# Patient Record
Sex: Female | Born: 1985 | Race: White | Hispanic: No | Smoking: Current every day smoker
Health system: Southern US, Community
[De-identification: ages and names within clinical notes are randomized; demographics above are authoritative.]

## PROBLEM LIST (undated history)

## (undated) DIAGNOSIS — F1990 Other psychoactive substance use, unspecified, uncomplicated: Secondary | ICD-10-CM

## (undated) DIAGNOSIS — Z7289 Other problems related to lifestyle: Secondary | ICD-10-CM

## (undated) HISTORY — PX: INDUCED ABORTION: SHX677

---

## 2009-02-14 ENCOUNTER — Ambulatory Visit: Payer: Self-pay | Admitting: Certified Nurse Midwife

## 2009-02-26 ENCOUNTER — Emergency Department: Payer: Self-pay | Admitting: Emergency Medicine

## 2010-09-05 ENCOUNTER — Emergency Department: Payer: Self-pay | Admitting: Emergency Medicine

## 2011-04-07 ENCOUNTER — Emergency Department: Payer: Self-pay | Admitting: Emergency Medicine

## 2011-12-27 ENCOUNTER — Emergency Department: Payer: Self-pay | Admitting: Emergency Medicine

## 2011-12-27 LAB — CBC
HCT: 40.3 % (ref 35.0–47.0)
HGB: 13.2 g/dL (ref 12.0–16.0)
MCH: 28.9 pg (ref 26.0–34.0)
MCHC: 32.7 g/dL (ref 32.0–36.0)
Platelet: 213 10*3/uL (ref 150–440)
RDW: 15.5 % — ABNORMAL HIGH (ref 11.5–14.5)
WBC: 9.8 10*3/uL (ref 3.6–11.0)

## 2011-12-27 LAB — COMPREHENSIVE METABOLIC PANEL
Anion Gap: 11 (ref 7–16)
Calcium, Total: 9 mg/dL (ref 8.5–10.1)
Chloride: 104 mmol/L (ref 98–107)
EGFR (African American): >60
Osmolality: 281 (ref 275–301)
Potassium: 3.5 mmol/L (ref 3.5–5.1)
SGOT(AST): 17 U/L (ref 15–37)
Sodium: 141 mmol/L (ref 136–145)
Total Protein: 7.6 g/dL (ref 6.4–8.2)

## 2011-12-27 LAB — URINALYSIS, COMPLETE
Blood: NEGATIVE
Glucose,UR: NEGATIVE mg/dL (ref 0–75)
Ketone: NEGATIVE
Ph: 5 (ref 4.5–8.0)
Protein: NEGATIVE
RBC,UR: 1 /HPF (ref 0–5)
WBC UR: 6 /HPF (ref 0–5)

## 2011-12-27 LAB — WET PREP, GENITAL

## 2011-12-27 LAB — PREGNANCY, URINE: Pregnancy Test, Urine: NEGATIVE m[IU]/mL

## 2013-11-16 ENCOUNTER — Emergency Department: Payer: Self-pay | Admitting: Emergency Medicine

## 2014-06-26 ENCOUNTER — Emergency Department: Payer: Self-pay | Admitting: Emergency Medicine

## 2014-06-26 LAB — CBC WITH DIFFERENTIAL/PLATELET
BASOS PCT: 0.5 %
Basophil #: 0.1 10*3/uL (ref 0.0–0.1)
EOS ABS: 0.1 10*3/uL (ref 0.0–0.7)
Eosinophil %: 1 %
HCT: 40.6 % (ref 35.0–47.0)
HGB: 13.1 g/dL (ref 12.0–16.0)
Lymphocyte #: 2.3 10*3/uL (ref 1.0–3.6)
Lymphocyte %: 16.7 %
MCH: 28.7 pg (ref 26.0–34.0)
MCHC: 32.2 g/dL (ref 32.0–36.0)
MCV: 89 fL (ref 80–100)
MONO ABS: 0.9 x10 3/mm (ref 0.2–0.9)
Monocyte %: 6.3 %
NEUTROS PCT: 75.5 %
Neutrophil #: 10.6 10*3/uL — ABNORMAL HIGH (ref 1.4–6.5)
Platelet: 208 10*3/uL (ref 150–440)
RBC: 4.56 10*6/uL (ref 3.80–5.20)
RDW: 15.8 % — ABNORMAL HIGH (ref 11.5–14.5)
WBC: 14 10*3/uL — ABNORMAL HIGH (ref 3.6–11.0)

## 2014-06-26 LAB — HCG, QUANTITATIVE, PREGNANCY: Beta Hcg, Quant.: 70386 m[IU]/mL — ABNORMAL HIGH

## 2014-10-25 ENCOUNTER — Emergency Department (HOSPITAL_COMMUNITY)
Admission: EM | Admit: 2014-10-25 | Discharge: 2014-10-25 | Disposition: A | Discharge status: Home / Self Care | Payer: Self-pay | Attending: Emergency Medicine | Admitting: Emergency Medicine

## 2014-10-25 ENCOUNTER — Encounter (HOSPITAL_COMMUNITY): Payer: Self-pay

## 2014-10-25 DIAGNOSIS — R51 Headache: Secondary | ICD-10-CM | POA: Insufficient documentation

## 2014-10-25 DIAGNOSIS — F1920 Other psychoactive substance dependence, uncomplicated: Secondary | ICD-10-CM

## 2014-10-25 DIAGNOSIS — Z720 Tobacco use: Secondary | ICD-10-CM | POA: Insufficient documentation

## 2014-10-25 DIAGNOSIS — F1010 Alcohol abuse, uncomplicated: Secondary | ICD-10-CM | POA: Insufficient documentation

## 2014-10-25 DIAGNOSIS — R45851 Suicidal ideations: Secondary | ICD-10-CM

## 2014-10-25 DIAGNOSIS — Z79899 Other long term (current) drug therapy: Secondary | ICD-10-CM | POA: Insufficient documentation

## 2014-10-25 HISTORY — DX: Other psychoactive substance use, unspecified, uncomplicated: F19.90

## 2014-10-25 HISTORY — DX: Other problems related to lifestyle: Z72.89

## 2014-10-25 LAB — CBC
HEMATOCRIT: 42.2 % (ref 36.0–46.0)
Hemoglobin: 14.2 g/dL (ref 12.0–15.0)
MCH: 29.3 pg (ref 26.0–34.0)
MCHC: 33.6 g/dL (ref 30.0–36.0)
MCV: 87.2 fL (ref 78.0–100.0)
Platelets: 295 10*3/uL (ref 150–400)
RBC: 4.84 MIL/uL (ref 3.87–5.11)
RDW: 15.3 % (ref 11.5–15.5)
WBC: 10.3 10*3/uL (ref 4.0–10.5)

## 2014-10-25 LAB — COMPREHENSIVE METABOLIC PANEL
ALBUMIN: 4.2 g/dL (ref 3.5–5.2)
ALT: 11 U/L (ref 0–35)
AST: 14 U/L (ref 0–37)
Alkaline Phosphatase: 60 U/L (ref 39–117)
Anion gap: 12 (ref 5–15)
BUN: 4 mg/dL — AB (ref 6–23)
CALCIUM: 9.6 mg/dL (ref 8.4–10.5)
CO2: 25 mEq/L (ref 19–32)
CREATININE: 0.98 mg/dL (ref 0.50–1.10)
Chloride: 98 mEq/L (ref 96–112)
GFR calc non Af Amer: 78 mL/min — ABNORMAL LOW (ref >90)
Glucose, Bld: 97 mg/dL (ref 70–99)
POTASSIUM: 3.2 meq/L — AB (ref 3.7–5.3)
Sodium: 135 mEq/L — ABNORMAL LOW (ref 137–147)
TOTAL PROTEIN: 7.4 g/dL (ref 6.0–8.3)
Total Bilirubin: 0.2 mg/dL — ABNORMAL LOW (ref 0.3–1.2)

## 2014-10-25 LAB — SALICYLATE LEVEL

## 2014-10-25 LAB — ACETAMINOPHEN LEVEL: Acetaminophen (Tylenol), Serum: <15 ug/mL (ref 10–30)

## 2014-10-25 LAB — ETHANOL: Alcohol, Ethyl (B): <11 mg/dL (ref 0–11)

## 2014-10-25 NOTE — ED Provider Notes (Signed)
CSN: [id number]     Arrival date & time 10/25/14  1343 History   First MD Initiated Contact with Patient 10/25/14 1359     Chief Complaint  Patient presents with  . Drug Problem     (Consider location/radiation/quality/duration/timing/severity/associated sxs/prior Treatment) HPI Comments: 28 year old female with history of alcohol abuse, smoker, cocaine abuse, marijuana abuse, depression presents wanting help for drug abuse and suicidal ideation. Patient's had gradually worsening depressive symptoms for the past few months. Patient does have children and says she wants to be alive for them. Patient says she could never kill herself she uses had worsening depressive symptoms. Patient last used cocaine history evening and woke up with her mother demanding she gets help.  Patient's children are with the father currently. Patient patient is a Leisure centre managerbartender at work.  Patient is a 28 y.o. female presenting with drug problem. The history is provided by the patient and a relative.  Drug Problem Associated symptoms include headaches (gradual onset similar to previous). Pertinent negatives include no chest pain, no abdominal pain and no shortness of breath.    Past Medical History  Diagnosis Date  . Behavior control problems associated with use of recreational drug    Past Surgical History  Procedure Laterality Date  . Induced abortion     History reviewed. No pertinent family history. History  Substance Use Topics  . Smoking status: Current Every Day Smoker    Types: Cigarettes  . Smokeless tobacco: Not on file  . Alcohol Use: Yes     Comment: social   OB History    No data available     Review of Systems  Constitutional: Negative for fever and chills.  HENT: Negative for congestion.   Eyes: Negative for visual disturbance.  Respiratory: Negative for shortness of breath.   Cardiovascular: Negative for chest pain.  Gastrointestinal: Negative for vomiting and abdominal pain.   Genitourinary: Negative for dysuria and flank pain.  Musculoskeletal: Negative for back pain, neck pain and neck stiffness.  Skin: Negative for rash.  Neurological: Positive for headaches (gradual onset similar to previous). Negative for light-headedness.  Psychiatric/Behavioral: Positive for self-injury and dysphoric mood.      Allergies  Clindamycin/lincomycin  Home Medications   Prior to Admission medications   Medication Sig Start Date End Date Taking? Authorizing Provider  ibuprofen (ADVIL,MOTRIN) 800 MG tablet Take 800 mg by mouth every 8 (eight) hours as needed for headache.   Yes Historical Provider, MD  Pseudoeph-Doxylamine-DM-APAP (NYQUIL PO) Take 1 capsule by mouth every 6 (six) hours.   Yes Historical Provider, MD   BP 119/81 mmHg  Pulse 88  Temp(Src) 98 F (36.7 C) (Oral)  Resp 20  SpO2 98%  LMP 10/18/2014 Physical Exam  Constitutional: She is oriented to person, place, and time. She appears well-developed and well-nourished.  HENT:  Head: Normocephalic and atraumatic.  Eyes: Conjunctivae are normal. Right eye exhibits no discharge. Left eye exhibits no discharge.  Neck: Normal range of motion. Neck supple. No tracheal deviation present.  Cardiovascular: Normal rate and regular rhythm.   Pulmonary/Chest: Effort normal and breath sounds normal.  Abdominal: Soft. She exhibits no distension. There is no tenderness. There is no guarding.  Musculoskeletal: She exhibits no edema.  Neurological: She is alert and oriented to person, place, and time. No cranial nerve deficit.  Skin: Skin is warm. No rash noted.  Psychiatric: She is not agitated. She expresses suicidal ideation. She expresses no homicidal ideation. She expresses no suicidal plans and no homicidal  plans.  Patient tearing in the room, says she wants to get off the drugs.  Nursing note and vitals reviewed.   ED Course  Procedures (including critical care time) Labs Review Labs Reviewed  CBC   ACETAMINOPHEN LEVEL  COMPREHENSIVE METABOLIC PANEL  ETHANOL  SALICYLATE LEVEL  URINE RAPID DRUG SCREEN (HOSP PERFORMED)    Imaging Review No results found.   EKG Interpretation None      MDM   Final diagnoses:  Suicidal ideation  Drug abuse and dependence   Patient presents with primarily drug abuse and wanting treatment. Patient has passive suicidal ideation without a plan. Plan for screening blood work, TTS consult and likely outpatient treatment.  Patient not actively suicidal in the ER, TTS agrees with assessment and recommends outpatient follow-up.  Results and differential diagnosis were discussed with the patient/parent/guardian. Close follow up outpatient was discussed, comfortable with the plan.   Medications - No data to display  Filed Vitals:   10/25/14 1355  BP: 119/81  Pulse: 88  Temp: 98 F (36.7 C)  TempSrc: Oral  Resp: 20  SpO2: 98%    Final diagnoses:  Suicidal ideation  Drug abuse and dependence        Enid SkeensJoshua M Bobbette Eakes, MD 10/25/14 1459

## 2014-10-25 NOTE — Discharge Instructions (Signed)
°Emergency Department Resource Guide °1) Find a Doctor and Pay Out of Pocket °Although you won't have to find out who is covered by your insurance plan, it is a good idea to ask around and get recommendations. You will then need to call the office and see if the doctor you have chosen will accept you as a new patient and what types of options they offer for patients who are self-pay. Some doctors offer discounts or will set up payment plans for their patients who do not have insurance, but you will need to ask so you aren't surprised when you get to your appointment. ° °2) Contact Your Local Health Department °Not all health departments have doctors that can see patients for sick visits, but many do, so it is worth a call to see if yours does. If you don't know where your local health department is, you can check in your phone book. The CDC also has a tool to help you locate your state's health department, and many state websites also have listings of all of their local health departments. ° °3) Find a Walk-in Clinic °If your illness is not likely to be very severe or complicated, you may want to try a walk in clinic. These are popping up all over the country in pharmacies, drugstores, and shopping centers. They're usually staffed by nurse practitioners or physician assistants that have been trained to treat common illnesses and complaints. They're usually fairly quick and inexpensive. However, if you have serious medical issues or chronic medical problems, these are probably not your best option. ° °No Primary Care Doctor: °- Call [facility] at  832-8000 - they can help you locate a primary care doctor that  accepts your insurance, provides certain services, etc. °- Physician Referral Service- 1-800-533-3463 ° °Chronic Pain Problems: °Organization         Address  Phone   Notes  °[facility]  (336) 297-2271 Patients need to be referred by their primary care doctor.  ° °Medication  Assistance: °Organization         Address  Phone   Notes  °[place] [facility] [address] °[place], [place] [address] (336) 641-8030 --Must be a resident of [place] °-- Must have NO insurance coverage whatsoever (no Medicaid/ Medicare, etc.) °-- The pt. MUST have a primary care doctor that directs their care regularly and follows them in the community °  °[facility]  (866) 331-1348   °[employer]  (888) 892-1162   ° °Agencies that provide inexpensive medical care: °Organization         Address  Phone   Notes  °[facility]  (336) 832-8035   °[facility]    (336) 832-7272   °[facility] [address] °[place],  [address] (336) 832-4777   °[facility] [address], °[place] (336) 271-4999   °[facility]    (336) 373-0678   °[facility]    (336) 272-1050   °[facility] ° [address], [place] Phone:  (336) 832-4444, Fax:  (336) 832-4440 Hours of Operation:  9 am - 6 pm, M-F.  Also accepts Medicaid/Medicare and self-pay.  °[facility] ° [address], [place] Phone: (336) 832-3150, Fax: (336) 832-3151. Hours of Operation:  8:30 am - 5:30 pm, M-F.  Also accepts Medicaid and self-pay.  °[facility] [place] [address], [place] Phone: (336) 878-6027   °[facility] [address], [place], [place] (336)723-1848, Ext. 123 Mondays & Thursdays: 7-9 AM.  First 15 patients are seen on a first come, first serve basis. °  ° °Medicaid-accepting [place] Providers: ° °Organization         Address  Phone   Notes  °[facility] [address], [place] (336) 641-2100 Also accepts self-pay patients.  °[facility] [address], [place] ° (336) 856-9996   °[facility] [address], [place]  (336) 288-8857   °[facility] [address] [place] [address], [place] (336) 299-7000   °Veita Bland [address], [place]  ° (336) 373-1557 Only accepts [place] Access Medicaid patients after they have their name applied to their card.  ° °Self-Pay (no insurance) in [place]: ° °Organization         Address  Phone   Notes  °Sickle Cell Patients, [facility] [address], [place] (336) 832-1970   °[facility] [address], [place] (336) 832-4400   °[facility] ° [address], [place] (336) 992-4800   °[facility]/Dr. Osei-Bonsu ° [address] [place] [address], [place] or [address], [place] (336) 841-8500 Phone number for both [place] and [place] locations is the same.  °[facility] [address], [place] (336) 299-0000   °[facility] [address] [place] [address], [place] or [address] (336) 852-7530 °(336) 878-2260   °[facility] [address], [place] (336) 350-1642, phone; (336) 294-5005, fax Sees patients 1st and 3rd Saturday of every month.  Must not qualify for public or private insurance (i.e. Medicaid, Medicare, [place] Health Choice, Veterans' Benefits) • Household income should be no more than 200% of the poverty level •The clinic cannot treat you if you are pregnant or think you are pregnant • Sexually transmitted diseases are not treated at the clinic.  ° ° °Dental Care: °Organization         Address  Phone  Notes  °[place] [facility] [address],  (336) 641-6152 Accepts children up to age 21 who are enrolled in Medicaid or [place] Health Choice; pregnant women with a Medicaid card; and children who have applied for Medicaid or [place] Health Choice, but were declined, whose parents can pay a reduced fee at time of service.  °[place]  [facility] [place]  [address], [place] (336) 641-7733 Accepts children up to age 21 who are enrolled in Medicaid or [place] Health Choice; pregnant women with a Medicaid card; and children who have applied for Medicaid or [place] Health Choice, but were declined, whose parents can pay a reduced fee at time of service.  °[facility] ° [address],  (336) 641-4533 Patients are seen by appointment only. Walk-ins are not accepted. [facility] will see patients 18 years of age and older. °Monday - Tuesday (8am-5pm) °Most Wednesdays (8:30-5pm) °$30 per visit, cash only  °[facility] ° [address], [place] (336) 641-4533 Patients are seen by appointment only. Walk-ins are not accepted. [facility] will see patients 18 years of age and older. °One   Wednesday Evening (Monthly: Volunteer Based).  $30 per visit, cash only  °[facility]  (919) 537-3737 for adults; Children under age 4, call Graduate Pediatric Dentistry at (919) [phone]. Children aged 4-14, please call (919) [phone] to request a pediatric application. ° Dental services are provided in all areas of dental care including fillings, crowns and bridges, complete and partial dentures, implants, gum treatment, root canals, and extractions. Preventive care is also provided. Treatment is provided to both adults and children. °Patients are selected via a lottery and there is often a waiting list. °  °[facility] [address], °[place] ° (336) 763-8833 [email or web address] °  °[facility] [address], [place], [place] (336)723-1848, Ext. 123 Second and Fourth Thursday of each month, opens at 6:30 AM; Clinic ends at 9 AM.  Patients are seen on a first-come first-served basis, and a limited number are seen during each clinic.  ° °[facility] ° [address], [place], [place] (336) 723-7904    Eligibility Requirements °You must have lived in [place], [place], or [place] counties for at least the last three months. °  You cannot be eligible for state or federal sponsored healthcare insurance, including Veterans Administration, Medicaid, or Medicare. °  You generally cannot be eligible for healthcare insurance through your employer.  °  How to apply: °Eligibility screenings are held every Tuesday and Wednesday afternoon from 1:00 pm until 4:00 pm. You do not need an appointment for the interview!  °[facility] [address], [place], [place] 336-631-2330   °[facility]  336-342-8273   °[facility]  336-703-3100   °[facility]  336-570-6415   ° °Behavioral Health Resources in the Community: °Intensive Outpatient Programs °Organization         Address  Phone  Notes  °[facility] [address], [place], [place] 336-878-6098   °[facility] [address], [place], [place] 336-832-9800   °[facility] [address], [place], [place] ° 336-882-2125   °[facility] [address],  °[place], [place] 1-800-853-5163 or 336-641-4981   °Substance Abuse Resources °Organization         Address  Phone  Notes  °[facility]  336-882-2125   °[facility]  336-784-9470   °[facility]  336-285-9073   °[facility]  336-845-3988   °Residential & Outpatient Substance Abuse Program  1-800-659-3381   °Psychological Services °Organization         Address  Phone  Notes  °[facility]  336- 832-9600   °[facility]  336- 378-7881   °[facility] [address], [place] 1-800-853-5163 or 336-641-4981   ° °Mobile Crisis Teams °Organization         Address  Phone  Notes  °[facility], Mobile Crisis Care Unit  1-877-626-1772   °Assertive °[facility] ° [address]  [place], [place] 336-834-9664   °Sharon DeEsch [address] °[place] 336-554-5454   ° °Self-Help/Support Groups °Organization         Address  Phone             Notes  °[facility] [place] - variety of support groups  336- 373-1402 Call for more information  °Narcotics Anonymous (NA), [facility] [address], °[place]  2 meetings at this location  ° °  Residential Treatment Programs °Organization         Address  Phone  Notes  °[facility] [address],    °[place]  1-866-801-8205   °[facility] ° [address], [place], [place] 704-293-8524   °[facility] [address], [place] 336-845-3988 Admissions: 8am-3pm M-F  °[facility] [address]    °[place], [place] 336-841-1104   °[facility] [address], [place], [place] 336-379-7146   °[facility] [address]  °[place], [place] 336-285-9073   °[facility] Intensive Outpatient [address], [place], [place] 336-852-3033   °[facility] [address]  °[place], [place] 1-877-615-2722 or 336-784-9470   °[facility] [address] , [place] 336-227-7417 Accepts Medicaid  °[facility] [address]  °[place] 1-800-659-3381 Substance Abuse/Addiction Treatment  ° °[place] Behavioral Health Resources °Organization         Address  Phone  Notes  °[facility]  (888) 581-9988   °Julie Brannon, PhD [address] [place], [place]   (336) 349-5553 or (336) 951-0000   °[facility]   [address] °[place], [place] (336) 349-4454   °[facility] [address], [place], [place] (336) 342-8316 Insurance/Medicaid/sponsorship through [facility]  °[facility] [address]                                    [place], [place] (336) 342-8316 Therapy/tele-psych/case    °[facility] [address]  ° [place],  (336) 349-2233    °Dr. Arfeen  (336) 349-4544   °[facility] [place]  [employer] [place] [facility] 1) [address], [place] °2) [address], [place] °3)  [address], [place] (336) 349-3220 °(336) 342-7768 ° °(336) 342-8140   °[place] [facility] (336) 342-1394 or (336) 342-3537 (After Hours)    ° ° °

## 2014-10-25 NOTE — BH Assessment (Signed)
Writer received collateral clinical information from Dr. Jodi MourningZavitz.

## 2014-10-25 NOTE — BH Assessment (Signed)
Tele Assessment Note   Brianna Mcbride is an 28 y.o. female requesting detox and treatment from substance abuse.  Patient reports passive SI without a plan.  Patient reports depression associated with her inability to stop using drugs.   Patient's had gradually worsening depressive symptoms for the past few months.   Patient does have children and says she wants to be alive for them. Patient says she could never kill herself she uses had worsening depressive symptoms.  Patient denies prior outpatient mental health servicdes.     Patient last used cocaine today before coming to the ED.  Patient reports that her children are with the father currently.  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  Patient denies HI and Psychosis.  Patient denies physical, sexual or emotional abuse.   Patient denies withdrawal symptoms.  Patient denies seizures.  Patient denies previous detox or treatment for substance abuse.    Axis I: Cocaine Abuse  Axis II: Deferred Axis III:  Past Medical History  Diagnosis Date  . Behavior control problems associated with use of recreational drug    Axis IV: economic problems, other psychosocial or environmental problems and problems related to social environment Axis V: 41-50 serious symptoms  Past Medical History:  Past Medical History  Diagnosis Date  . Behavior control problems associated with use of recreational drug     Past Surgical History  Procedure Laterality Date  . Induced abortion      Family History: History reviewed. No pertinent family history.  Social History:  reports that she has been smoking Cigarettes.  She has been smoking about 0.00 packs per day. She does not have any smokeless tobacco history on file. She reports that she drinks alcohol. She reports that she uses illicit drugs (Cocaine and Marijuana).  Additional Social History:     CIWA: CIWA-Ar BP: 119/81 mmHg Pulse Rate: 88 COWS:    PATIENT STRENGTHS: (choose at  least two) Ability for insight Average or above average intelligence Capable of independent living Communication skills  Allergies:  Allergies  Allergen Reactions  . Clindamycin/Lincomycin Rash    Home Medications:  (Not in a hospital admission)  OB/GYN Status:  Patient's last menstrual period was 10/18/2014.  General Assessment Data Location of Assessment: [facility] Is this a Tele or Face-to-Face Assessment?: Tele Assessment Is this an Initial Assessment or a Re-assessment for this encounter?: Initial Assessment Living Arrangements: Other (Comment) (Lives with a roomate and her minor child) Can pt return to current living arrangement?: Yes Admission Status: Voluntary Is patient capable of signing voluntary admission?: Yes Transfer from: Home Referral Source: Self/Family/Friend  Medical Screening Exam [facility] Walk-in ONLY) Medical Exam completed: Yes  [facility] Living Arrangements: Other (Comment) (Lives with a roomate and her minor child) Name of Psychiatrist: None Reported Name of Therapist: None Reported  Education Status Is patient currently in school?: No Current Grade: NA Highest grade of school patient has completed: NA Name of school: NA Contact person: NA  Risk to self with the past 6 months Suicidal Ideation: Yes-Currently Present Suicidal Intent: Yes-Currently Present Is patient at risk for suicide?: Yes Suicidal Plan?: No Access to Means: No What has been your use of drugs/alcohol within the last 12 months?: Cocaine Previous Attempts/Gestures: No How many times?: 0 Other Self Harm Risks: None Reported Triggers for Past Attempts:  (NA) Intentional Self Injurious Behavior: None Family Suicide History: No Recent stressful life event(s): Financial Problems, Other (Comment) (Cannot stop using  drugs.) Persecutory voices/beliefs?: No Depression: Yes Depression Symptoms: Despondent, Fatigue, Guilt Substance abuse history and/or  treatment for substance abuse?: Yes (Cocaine) Suicide prevention information given to non-admitted patients: Yes  Risk to Others within the past 6 months Homicidal Ideation: No Thoughts of Harm to Others: No Current Homicidal Intent: No Current Homicidal Plan: No Access to Homicidal Means: No Identified Victim: None Reported History of harm to others?: No Assessment of Violence: None Noted Violent Behavior Description: None Reported Does patient have access to weapons?: No Criminal Charges Pending?: No Does patient have a court date: No  Psychosis Hallucinations: None noted Delusions: None noted  Mental Status Report Appear/Hygiene: Disheveled Eye Contact: Fair Motor Activity: Freedom of movement Speech: Logical/coherent Level of Consciousness: Alert Mood: Depressed Affect: Appropriate to circumstance Anxiety Level: Minimal Thought Processes: Coherent, Relevant Judgement: Unimpaired Orientation: Person, Place, Time, Situation Obsessive Compulsive Thoughts/Behaviors: Minimal  Cognitive Functioning Concentration: Decreased Memory: Recent Intact, Remote Intact IQ: Average Insight: Good Impulse Control: Fair Appetite: Good Weight Loss: 0 Weight Gain: 0 Sleep: No Change Total Hours of Sleep: 8 Vegetative Symptoms: None  ADLScreening [facility] Assessment Services) Patient's cognitive ability adequate to safely complete daily activities?: Yes Patient able to express need for assistance with ADLs?: Yes Independently performs ADLs?: Yes (appropriate for developmental age)  Prior Inpatient Therapy Prior Inpatient Therapy: No Prior Therapy Dates: NA Prior Therapy Facilty/Provider(s): NA Reason for Treatment: NA  Prior Outpatient Therapy Prior Outpatient Therapy: No Prior Therapy Dates: NA Prior Therapy Facilty/Provider(s): NA Reason for Treatment: NA  ADL Screening (condition at time of admission) Patient's cognitive ability adequate to safely complete daily  activities?: Yes Patient able to express need for assistance with ADLs?: Yes Independently performs ADLs?: Yes (appropriate for developmental age)             Advance Directives (For Healthcare) Does patient have an advance directive?: No Would patient like information on creating an advanced directive?: No - patient declined information    Additional Information 1:1 In Past 12 Months?: No CIRT Risk: No Elopement Risk: No Does patient have medical clearance?: Yes     Disposition: Per Renata Capriceonrad, NP - patient does not meet criteria for inpatient hospitalization.  Patient will be discharged with outpatient resources.    Disposition Initial Assessment Completed for this Encounter: Yes Disposition of Patient: Other dispositions  Linton RumpStevenson, Brianna Mcbride 10/25/2014 2:43 PM

## 2014-10-25 NOTE — ED Notes (Signed)
Pt states she needs help stopping drugs including cocaine and marijuana.  Really stressed b/c of the "bullsh.." in her life.  Pt grandmother also states she has stated she wanted to kill her self.  Pt denies attempt but acknowledges verbalization.

## 2014-12-23 ENCOUNTER — Emergency Department: Payer: Self-pay | Admitting: Emergency Medicine

## 2015-10-14 IMAGING — CR DG SHOULDER 3+V*R*
1 series · 3 of 3 positions shown · non-contrast
Comparison: None.

CLINICAL DATA: 28-year-old female with 3 week history of right arm
pain which began after chopping [place].

EXAM:
DG SHOULDER 3+ VIEWS RIGHT

[Series 1: dxr shoulder right complete · 0.14mm/px · 3 of 3 slices shown]
[im 1/3]
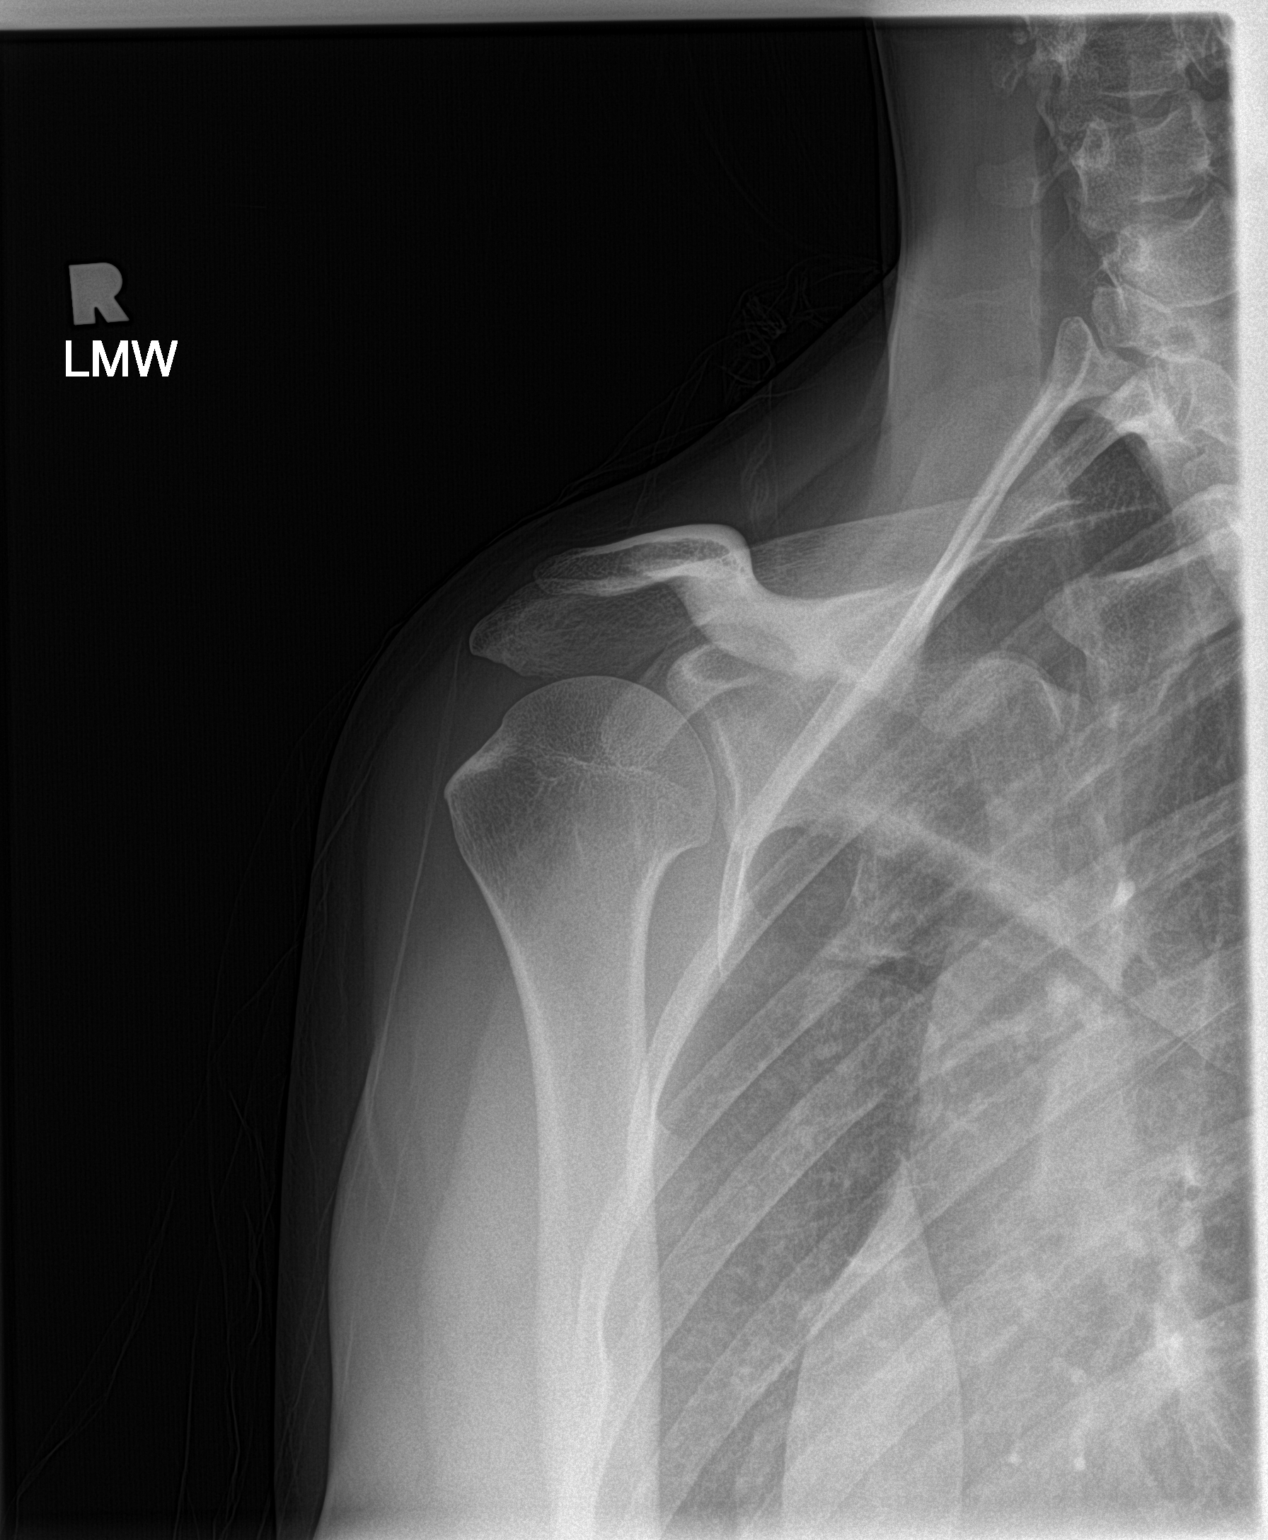
[im 2/3]
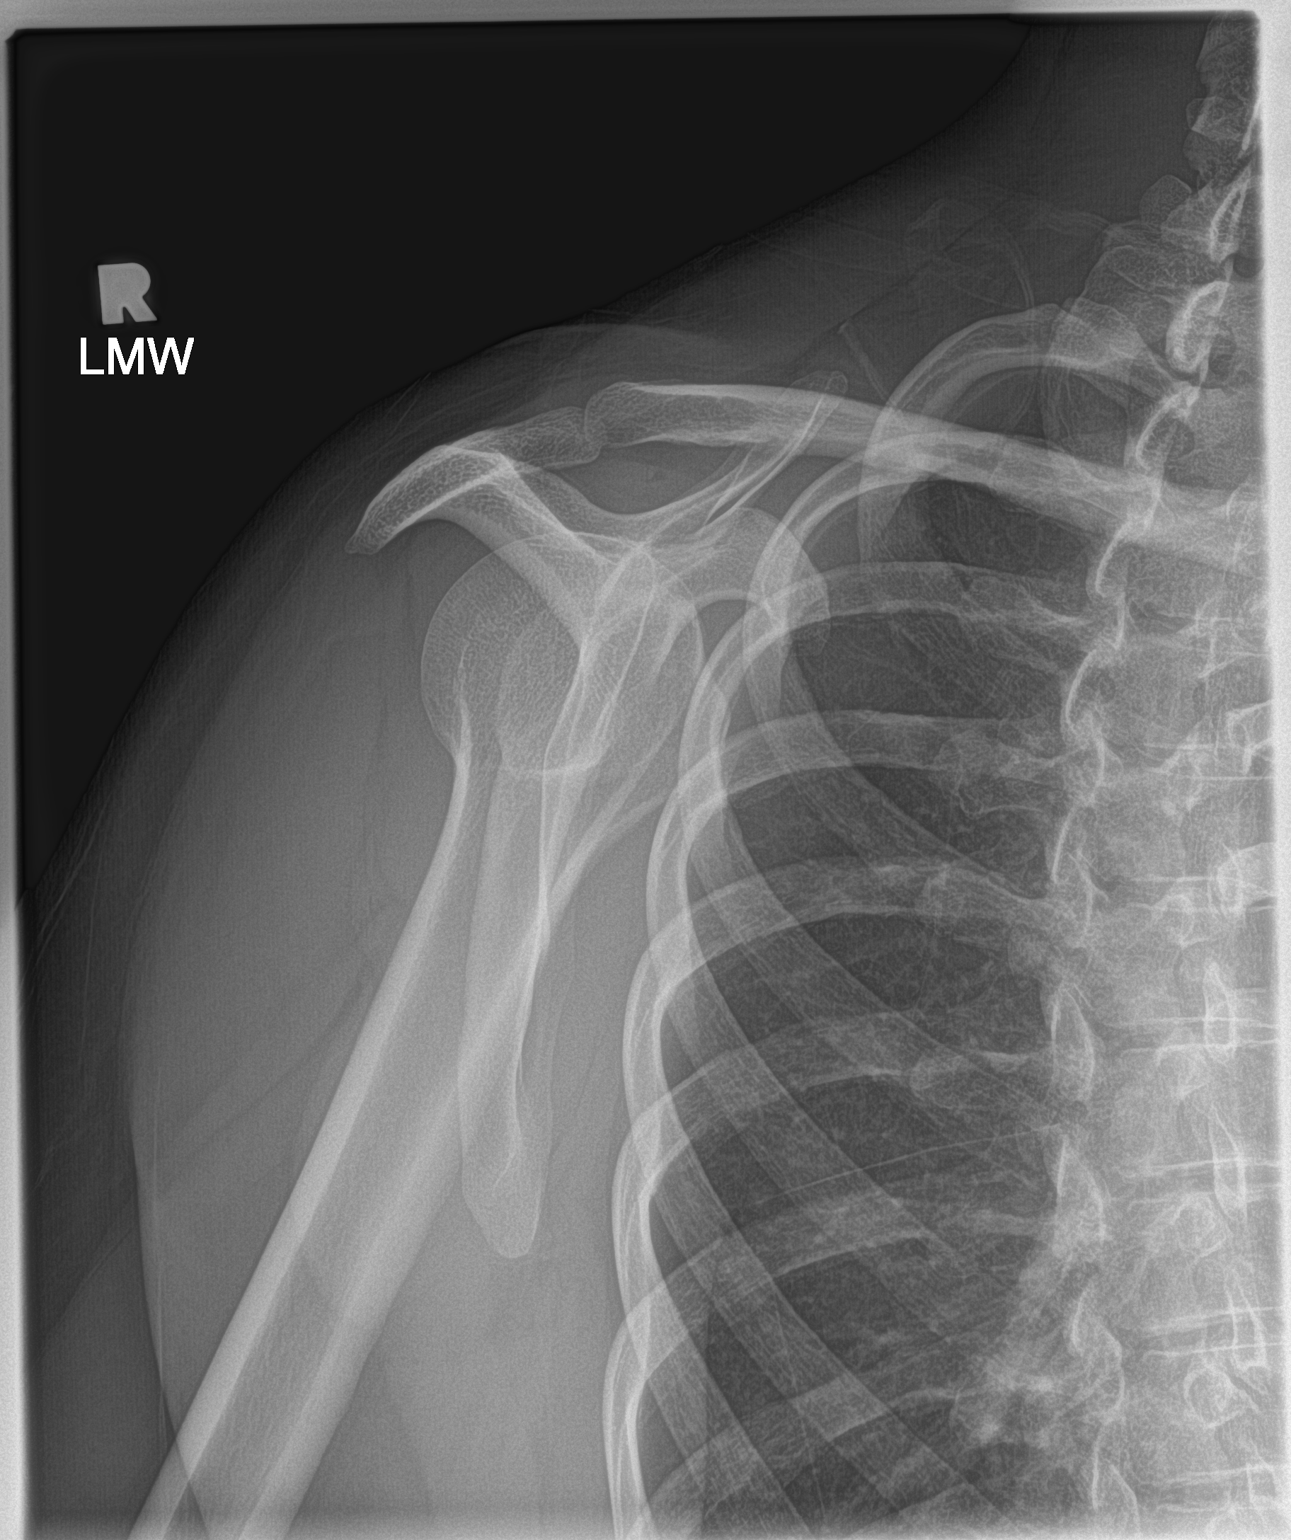
[im 3/3]
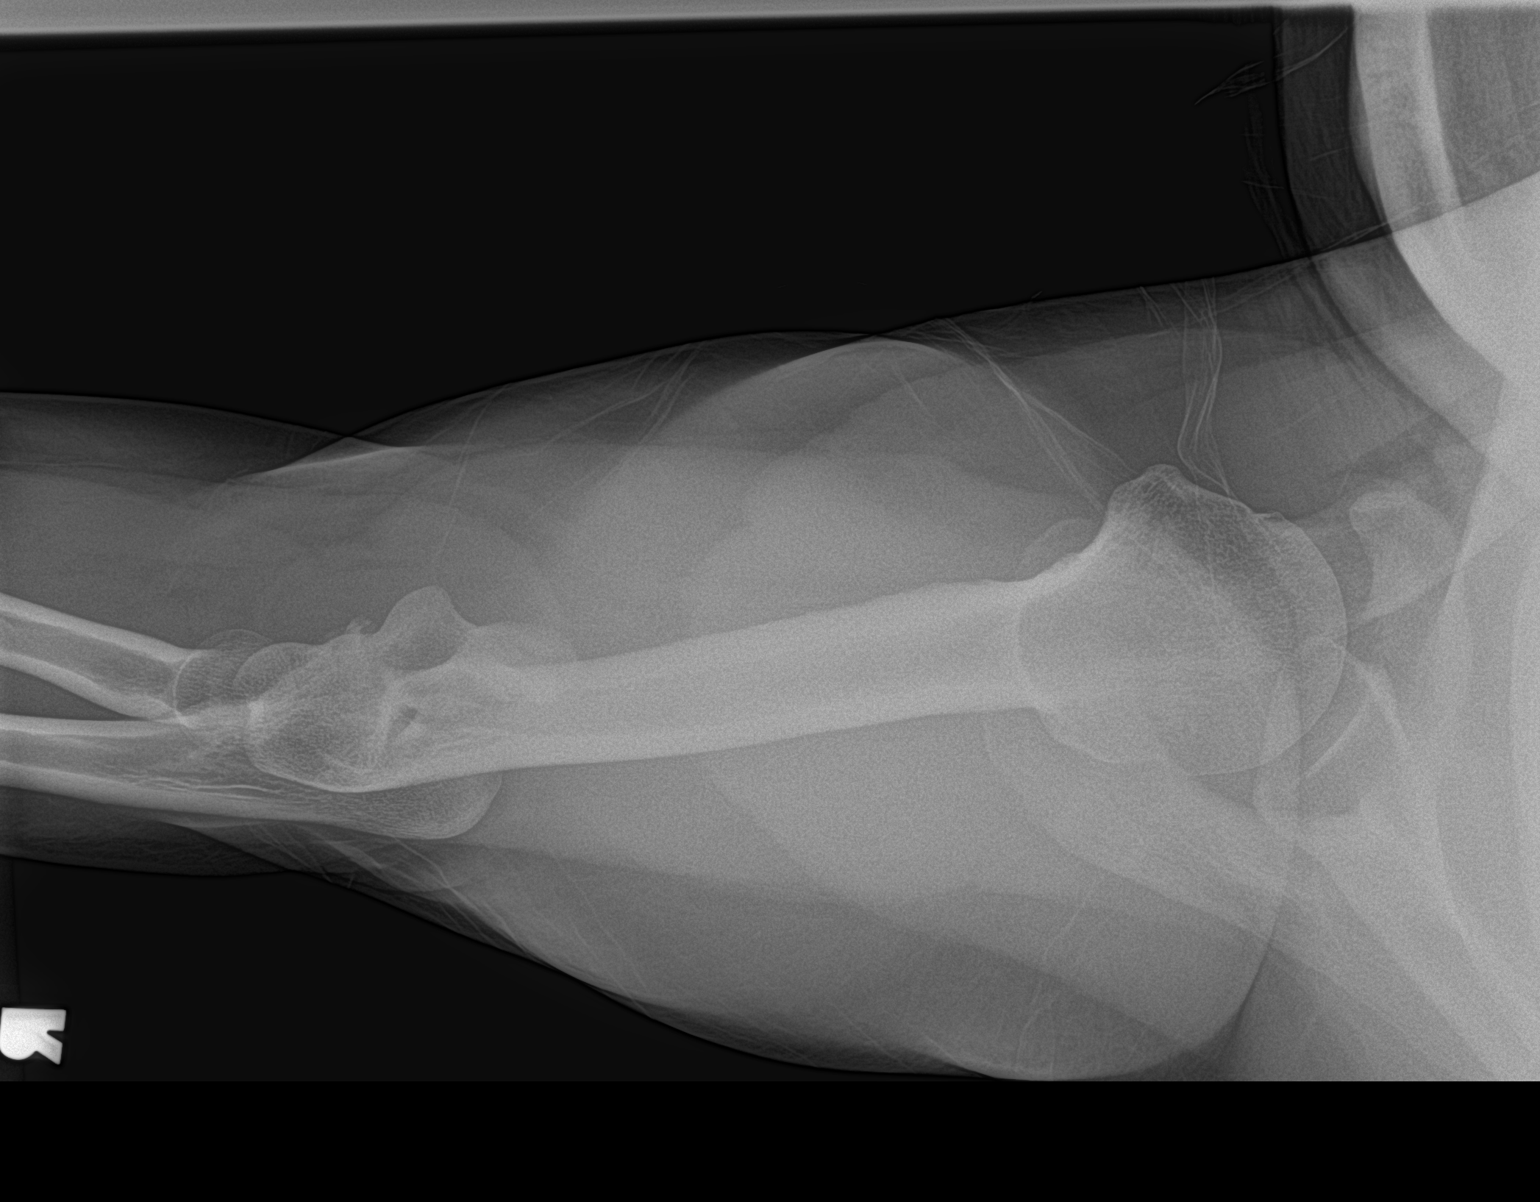

[3 of 3 positions shown; findings below may reference images not displayed]

FINDINGS: There is no evidence of fracture or dislocation. There is no
evidence of arthropathy or other focal bone abnormality. Soft
tissues are unremarkable.
IMPRESSION: Negative.
